# Patient Record
Sex: Female | Born: 1990 | Race: Black or African American | Hispanic: No | Marital: Single | Smoking: Never smoker
Health system: Southern US, Community
[De-identification: ages and names within clinical notes are randomized; demographics above are authoritative.]

## PROBLEM LIST (undated history)

## (undated) DIAGNOSIS — IMO0002 Reserved for concepts with insufficient information to code with codable children: Secondary | ICD-10-CM

## (undated) DIAGNOSIS — E785 Hyperlipidemia, unspecified: Secondary | ICD-10-CM

## (undated) DIAGNOSIS — Z8619 Personal history of other infectious and parasitic diseases: Secondary | ICD-10-CM

## (undated) DIAGNOSIS — G43909 Migraine, unspecified, not intractable, without status migrainosus: Secondary | ICD-10-CM

## (undated) DIAGNOSIS — M329 Systemic lupus erythematosus, unspecified: Secondary | ICD-10-CM

## (undated) HISTORY — PX: WISDOM TOOTH EXTRACTION: SHX21

## (undated) HISTORY — DX: Migraine, unspecified, not intractable, without status migrainosus: G43.909

## (undated) HISTORY — DX: Hyperlipidemia, unspecified: E78.5

## (undated) HISTORY — DX: Personal history of other infectious and parasitic diseases: Z86.19

---
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
## 2010-01-05 ENCOUNTER — Emergency Department (HOSPITAL_COMMUNITY): Admission: EM | Admit: 2010-01-05 | Discharge: 2010-01-05 | Payer: Self-pay | Admitting: Family Medicine

## 2010-10-24 LAB — POCT URINALYSIS DIP (DEVICE)
Bilirubin Urine: NEGATIVE
Glucose, UA: NEGATIVE mg/dL
Ketones, ur: NEGATIVE mg/dL
Nitrite: NEGATIVE
Protein, ur: 30 mg/dL — AB
Specific Gravity, Urine: 1.025 (ref 1.005–1.030)
Urobilinogen, UA: 0.2 mg/dL (ref 0.0–1.0)
pH: 6.5 (ref 5.0–8.0)

## 2010-10-24 LAB — POCT PREGNANCY, URINE: Preg Test, Ur: NEGATIVE

## 2010-10-24 LAB — GC/CHLAMYDIA PROBE AMP, GENITAL
Chlamydia, DNA Probe: NEGATIVE
GC Probe Amp, Genital: NEGATIVE

## 2010-10-24 LAB — WET PREP, GENITAL

## 2010-11-10 LAB — WET PREP, GENITAL
Clue Cells Wet Prep HPF POC: NONE SEEN
Trich, Wet Prep: NONE SEEN
Yeast Wet Prep HPF POC: NONE SEEN

## 2010-11-10 LAB — GC/CHLAMYDIA PROBE AMP, GENITAL: Chlamydia, DNA Probe: NEGATIVE

## 2010-11-12 LAB — POCT URINALYSIS DIP (DEVICE)
Protein, ur: 30 mg/dL — AB
Urobilinogen, UA: 1 mg/dL (ref 0.0–1.0)

## 2010-11-12 LAB — POCT PREGNANCY, URINE: Preg Test, Ur: NEGATIVE

## 2010-11-14 LAB — POCT PREGNANCY, URINE: Preg Test, Ur: NEGATIVE

## 2010-11-14 LAB — URINE CULTURE: Colony Count: 15000

## 2010-11-14 LAB — GC/CHLAMYDIA PROBE AMP, GENITAL
Chlamydia, DNA Probe: NEGATIVE
GC Probe Amp, Genital: NEGATIVE

## 2010-11-14 LAB — WET PREP, GENITAL: Trich, Wet Prep: NONE SEEN

## 2010-11-14 LAB — POCT URINALYSIS DIP (DEVICE)
Glucose, UA: NEGATIVE mg/dL
Ketones, ur: NEGATIVE mg/dL
Specific Gravity, Urine: 1.025 (ref 1.005–1.030)
Urobilinogen, UA: 1 mg/dL (ref 0.0–1.0)

## 2010-11-17 LAB — GC/CHLAMYDIA PROBE AMP, GENITAL
Chlamydia, DNA Probe: NEGATIVE
GC Probe Amp, Genital: NEGATIVE

## 2010-11-17 LAB — POCT URINALYSIS DIP (DEVICE)
Glucose, UA: NEGATIVE mg/dL
Specific Gravity, Urine: 1.025 (ref 1.005–1.030)
Urobilinogen, UA: 1 mg/dL (ref 0.0–1.0)

## 2010-11-17 LAB — WET PREP, GENITAL

## 2010-11-17 LAB — POCT PREGNANCY, URINE: Preg Test, Ur: NEGATIVE

## 2010-11-22 LAB — POCT URINALYSIS DIP (DEVICE)
Ketones, ur: NEGATIVE mg/dL
Nitrite: NEGATIVE
Protein, ur: NEGATIVE mg/dL
Urobilinogen, UA: 0.2 mg/dL (ref 0.0–1.0)
pH: 7 (ref 5.0–8.0)

## 2010-11-22 LAB — GC/CHLAMYDIA PROBE AMP, GENITAL
Chlamydia, DNA Probe: NEGATIVE
GC Probe Amp, Genital: NEGATIVE

## 2010-11-22 LAB — WET PREP, GENITAL
Trich, Wet Prep: NONE SEEN
Yeast Wet Prep HPF POC: NONE SEEN

## 2011-05-01 LAB — POCT URINALYSIS DIP (DEVICE)
Nitrite: NEGATIVE
Operator id: 235561
Protein, ur: 30 — AB
Urobilinogen, UA: 1
pH: 7

## 2011-05-01 LAB — GC/CHLAMYDIA PROBE AMP, GENITAL: Chlamydia, DNA Probe: POSITIVE — AB

## 2011-05-01 LAB — POCT PREGNANCY, URINE: Preg Test, Ur: NEGATIVE

## 2011-05-08 LAB — WET PREP, GENITAL
Clue Cells Wet Prep HPF POC: NONE SEEN
Trich, Wet Prep: NONE SEEN
Yeast Wet Prep HPF POC: NONE SEEN

## 2011-05-08 LAB — POCT URINALYSIS DIP (DEVICE)
Bilirubin Urine: NEGATIVE
Ketones, ur: NEGATIVE
Protein, ur: 30 — AB
Specific Gravity, Urine: 1.01
pH: 7

## 2011-05-08 LAB — GC/CHLAMYDIA PROBE AMP, GENITAL: GC Probe Amp, Genital: NEGATIVE

## 2011-05-11 LAB — POCT URINALYSIS DIP (DEVICE)
Glucose, UA: NEGATIVE mg/dL
Hgb urine dipstick: NEGATIVE
Nitrite: NEGATIVE
Protein, ur: NEGATIVE mg/dL
Urobilinogen, UA: 1 mg/dL (ref 0.0–1.0)

## 2011-05-11 LAB — WET PREP, GENITAL: WBC, Wet Prep HPF POC: NONE SEEN

## 2011-05-11 LAB — GC/CHLAMYDIA PROBE AMP, GENITAL: Chlamydia, DNA Probe: POSITIVE — AB

## 2011-05-11 LAB — POCT PREGNANCY, URINE: Preg Test, Ur: NEGATIVE

## 2013-11-28 DIAGNOSIS — M359 Systemic involvement of connective tissue, unspecified: Secondary | ICD-10-CM | POA: Insufficient documentation

## 2013-11-28 DIAGNOSIS — G43909 Migraine, unspecified, not intractable, without status migrainosus: Secondary | ICD-10-CM | POA: Insufficient documentation

## 2014-01-27 DIAGNOSIS — F419 Anxiety disorder, unspecified: Secondary | ICD-10-CM | POA: Insufficient documentation

## 2015-05-25 DIAGNOSIS — M1990 Unspecified osteoarthritis, unspecified site: Secondary | ICD-10-CM | POA: Insufficient documentation

## 2015-05-25 DIAGNOSIS — M329 Systemic lupus erythematosus, unspecified: Secondary | ICD-10-CM | POA: Insufficient documentation

## 2019-07-14 ENCOUNTER — Other Ambulatory Visit: Payer: Self-pay

## 2019-07-14 DIAGNOSIS — Z20822 Contact with and (suspected) exposure to covid-19: Secondary | ICD-10-CM

## 2019-07-15 LAB — NOVEL CORONAVIRUS, NAA: SARS-CoV-2, NAA: NOT DETECTED

## 2019-08-08 DIAGNOSIS — U071 COVID-19: Secondary | ICD-10-CM

## 2019-08-08 HISTORY — DX: COVID-19: U07.1

## 2019-08-12 ENCOUNTER — Ambulatory Visit: Payer: 59 | Attending: Internal Medicine

## 2019-08-12 DIAGNOSIS — Z20822 Contact with and (suspected) exposure to covid-19: Secondary | ICD-10-CM

## 2019-08-14 ENCOUNTER — Telehealth: Payer: Self-pay

## 2019-08-14 LAB — NOVEL CORONAVIRUS, NAA: SARS-CoV-2, NAA: DETECTED — AB

## 2019-08-14 NOTE — Telephone Encounter (Signed)
Patient advise that she can go back and retest her daughter 5-7 days after exposure. Patient verbalized understanding

## 2019-10-06 DIAGNOSIS — I639 Cerebral infarction, unspecified: Secondary | ICD-10-CM

## 2019-10-06 HISTORY — DX: Cerebral infarction, unspecified: I63.9

## 2019-10-23 DIAGNOSIS — G8194 Hemiplegia, unspecified affecting left nondominant side: Secondary | ICD-10-CM | POA: Insufficient documentation

## 2019-10-30 DIAGNOSIS — D709 Neutropenia, unspecified: Secondary | ICD-10-CM | POA: Insufficient documentation

## 2019-11-09 ENCOUNTER — Emergency Department (HOSPITAL_COMMUNITY): Payer: 59

## 2019-11-09 ENCOUNTER — Emergency Department (HOSPITAL_BASED_OUTPATIENT_CLINIC_OR_DEPARTMENT_OTHER): Payer: 59

## 2019-11-09 ENCOUNTER — Emergency Department (HOSPITAL_COMMUNITY)
Admission: EM | Admit: 2019-11-09 | Discharge: 2019-11-09 | Disposition: A | Discharge status: Home / Self Care | Payer: 59 | Attending: Emergency Medicine | Admitting: Emergency Medicine

## 2019-11-09 ENCOUNTER — Other Ambulatory Visit: Payer: Self-pay

## 2019-11-09 DIAGNOSIS — I69339 Monoplegia of upper limb following cerebral infarction affecting unspecified side: Secondary | ICD-10-CM | POA: Diagnosis not present

## 2019-11-09 DIAGNOSIS — Z7982 Long term (current) use of aspirin: Secondary | ICD-10-CM | POA: Insufficient documentation

## 2019-11-09 DIAGNOSIS — R52 Pain, unspecified: Secondary | ICD-10-CM | POA: Diagnosis not present

## 2019-11-09 DIAGNOSIS — Z8616 Personal history of COVID-19: Secondary | ICD-10-CM | POA: Diagnosis not present

## 2019-11-09 DIAGNOSIS — M3210 Systemic lupus erythematosus, organ or system involvement unspecified: Secondary | ICD-10-CM | POA: Diagnosis not present

## 2019-11-09 DIAGNOSIS — M25569 Pain in unspecified knee: Secondary | ICD-10-CM | POA: Insufficient documentation

## 2019-11-09 DIAGNOSIS — Z7902 Long term (current) use of antithrombotics/antiplatelets: Secondary | ICD-10-CM | POA: Diagnosis not present

## 2019-11-09 HISTORY — DX: Systemic lupus erythematosus, unspecified: M32.9

## 2019-11-09 HISTORY — DX: Reserved for concepts with insufficient information to code with codable children: IMO0002

## 2019-11-09 NOTE — ED Provider Notes (Signed)
[facility]-EMERGENCY DEPT Provider Note   CSN: [id number] Arrival date & time: 11/09/19  1541     History Chief Complaint  Patient presents with  . Knee Pain    Heidi Riley is a 29 y.o. female.  Heidi Riley is a 29 y.o. female+ with a history of stroke, lupus and COVID-19 infection, who presents to the ED for evaluation of pain behind both of her knees.  Patient states pain started yesterday.  She reports at 1 point she felt like she could feel a knot behind her right knee, but now that seems to have gone away.  She was just recently admitted to the hospital on 3/15 and diagnosed with an acute right thalamic stroke after coming in with sudden onset of left hemiparesis and paresthesias.  She has some paresthesia still in the left side but no other remaining deficits from stroke.  They are not sure what caused the stroke, and she is worried she could have blood clots in her legs.  She did have negative DVT studies while hospitalized.  She has been started on aspirin and Plavix but is not on any other blood thinners at this time.  Was on Lovenox while hospitalized.  She denies any strenuous activity or injury to the knees that she knows of.  She is not noticed any redness, swelling or warmth and has not had any fevers or chills.  Wonders if she could have pulled muscles.  No other aggravating or alleviating factors.  She denies any leg swelling.  No chest pain or shortness of breath.        Past Medical History:  Diagnosis Date  . COVID-19 08/2019  . Lupus (HCC)   . Stroke (HCC) 10/2019    There are no problems to display for this patient.     OB History   No obstetric history on file.     No family history on file.  Social History   Tobacco Use  . Smoking status: Not on file  Substance Use Topics  . Alcohol use: Not on file  . Drug use: Not on file    Home Medications Prior to Admission medications   Not on File    Allergies      Penicillins  Review of Systems   Review of Systems  Constitutional: Negative for chills and fever.  Respiratory: Negative for cough and shortness of breath.   Cardiovascular: Negative for chest pain and leg swelling.  Musculoskeletal: Positive for arthralgias. Negative for joint swelling.  Skin: Negative for color change and rash.  Neurological: Negative for weakness and numbness.  All other systems reviewed and are negative.   Physical Exam Updated Vital Signs BP 117/70 (BP Location: Left Arm)   Pulse 68   Temp 98.2 F (36.8 C) (Oral)   Resp 15   Ht 5\' 6"  (1.676 m)   Wt 68 kg   SpO2 100%   BMI 24.21 kg/m   Physical Exam Vitals and nursing note reviewed.  Constitutional:      General: She is not in acute distress.    Appearance: Normal appearance. She is well-developed and normal weight. She is not ill-appearing or diaphoretic.  HENT:     Head: Normocephalic and atraumatic.  Eyes:     General:        Right eye: No discharge.        Left eye: No discharge.  Pulmonary:     Effort: Pulmonary effort is normal. No respiratory distress.  Musculoskeletal:        General: Tenderness present.     Cervical back: Neck supple.     Comments: There is tenderness over the posterior aspect of both knees without any palpable deformity, no palpable Baker's cyst.  There is no erythema, warmth or swelling.  Distal pulses are 2+, there is no calf tenderness or palpable cord.  Skin:    General: Skin is warm and dry.  Neurological:     General: No focal deficit present.     Mental Status: She is alert and oriented to person, place, and time.     Coordination: Coordination normal.     Comments: 5/5 strength in bilateral lower extremities, sensation intact  Psychiatric:        Mood and Affect: Mood normal.        Behavior: Behavior normal.     ED Results / Procedures / Treatments   Labs (all labs ordered are listed, but only abnormal results are displayed) Labs Reviewed - No data  to display  EKG None  Radiology VAS [place] LOWER EXTREMITY VENOUS (DVT) ([facility] and [facility] 7a-7p)  Result Date: 11/09/2019  Lower Venous DVTStudy Indications: Pain, and Patient states she feels "knots" behind her knees.  Limitations: Body habitus. Comparison Study: No prior study on file Performing Technologist: Sharion Dove RVS  Examination Guidelines: A complete evaluation includes B-mode imaging, spectral Doppler, color Doppler, and power Doppler as needed of all accessible portions of each vessel. Bilateral testing is considered an integral part of a complete examination. Limited examinations for reoccurring indications may be performed as noted. The reflux portion of the exam is performed with the patient in reverse Trendelenburg.  +---------+---------------+---------+-----------+----------+--------------+ RIGHT    CompressibilityPhasicitySpontaneityPropertiesThrombus Aging +---------+---------------+---------+-----------+----------+--------------+ CFV      Full           Yes      Yes                                 +---------+---------------+---------+-----------+----------+--------------+ SFJ      Full                                                        +---------+---------------+---------+-----------+----------+--------------+ FV Prox  Full                                                        +---------+---------------+---------+-----------+----------+--------------+ FV Mid   Full                                                        +---------+---------------+---------+-----------+----------+--------------+ FV DistalFull                                                        +---------+---------------+---------+-----------+----------+--------------+ PFV  Full                                                        +---------+---------------+---------+-----------+----------+--------------+ POP      Full           Yes      Yes                                  +---------+---------------+---------+-----------+----------+--------------+ PTV      Full                                                        +---------+---------------+---------+-----------+----------+--------------+ PERO     Full                                                        +---------+---------------+---------+-----------+----------+--------------+   +---------+---------------+---------+-----------+----------+--------------+ LEFT     CompressibilityPhasicitySpontaneityPropertiesThrombus Aging +---------+---------------+---------+-----------+----------+--------------+ CFV      Full           Yes      Yes                                 +---------+---------------+---------+-----------+----------+--------------+ SFJ      Full                                                        +---------+---------------+---------+-----------+----------+--------------+ FV Prox  Full                                                        +---------+---------------+---------+-----------+----------+--------------+ FV Mid   Full                                                        +---------+---------------+---------+-----------+----------+--------------+ FV DistalFull                                                        +---------+---------------+---------+-----------+----------+--------------+ PFV      Full                                                        +---------+---------------+---------+-----------+----------+--------------+  POP      Full           Yes      Yes                                 +---------+---------------+---------+-----------+----------+--------------+ PTV      Full                                                        +---------+---------------+---------+-----------+----------+--------------+ PERO     Full                                                         +---------+---------------+---------+-----------+----------+--------------+     Summary: BILATERAL: - No evidence of deep vein thrombosis seen in the lower extremities, bilaterally.  RIGHT: - No cystic structure found in the popliteal fossa.  LEFT: - No cystic structure found in the popliteal fossa.  *See table(s) above for measurements and observations.    Preliminary     Procedures Procedures (including critical care time)  Medications Ordered in ED Medications - No data to display  ED Course  I have reviewed the triage vital signs and the nursing notes.  Pertinent labs & imaging results that were available during my care of the patient were reviewed by me and considered in my medical decision making (see chart for details).    MDM Rules/Calculators/A&P                     29 year old female presents with posterior knee pain bilaterally that started yesterday.  No trauma, no swelling or deformity.  Was recently diagnosed with a stroke, and is concerned she could have a blood clot she has not had swelling in the leg and does not have any calf pain on exam.  On exam she has no bony tenderness so I do not think that x-rays would be useful especially without any trauma or injury.  Will get bilateral DVT studies.  She has good pulses bilaterally, I have no concern for ischemia, she certainly could have pulled muscles  Vascular ultrasound completed bilateral DVT studies that showed no evidence of blood clot or Baker's cyst.  I discussed this information with the patient, she is frustrated that she does not know what is causing her knee pain-provided reassurance that we have ruled out life limiting organ threatening causes today, I have encouraged her to follow-up with her primary care doctor in the meantime recommend that she treat this like a strained muscle with Tylenol, ice, heat and over-the-counter muscle rubs or lidocaine patches.  Patient expresses understanding and agreement.  Discharged home  in good condition.  Final Clinical Impression(s) / ED Diagnoses Final diagnoses:  Posterior knee pain, unspecified laterality    Rx / DC Orders ED Discharge Orders    None       Dartha Lodge, [place] 11/10/19 1027    Terald Sleeper, MD 11/10/19 1105

## 2019-11-09 NOTE — ED Triage Notes (Signed)
PT to ED with c/o of knee pain and knots on the back of them. Pt states she recently was in the hospital for a stroke and wanted to make sure the pain and knots she was feeling weren't blood clots.

## 2019-11-09 NOTE — Discharge Instructions (Addendum)
Your evaluation today is reassuring did not show any evidence of a blood clot or Baker's cyst.  Your knees do not appear to be infected, and have good blood flow and normal nerve function.  This could be a pulled or irritated muscle or tendon.  You can take Tylenol, you can also use over-the-counter muscle rubs or lidocaine patches, and alternate ice and heat.  Please follow-up with your PCP for further evaluation.

## 2019-11-09 NOTE — Progress Notes (Signed)
VASCULAR LAB PRELIMINARY  PRELIMINARY  PRELIMINARY  PRELIMINARY  Bilateral lower extremity venous duplex completed.    Preliminary report:  See CV proc for preliminary results.  [address] PA-C, results  Kathaleen Dudziak, RVT 11/09/2019, 6:23 PM

## 2019-11-10 ENCOUNTER — Encounter (HOSPITAL_COMMUNITY): Payer: Self-pay | Admitting: Student

## 2020-01-25 DIAGNOSIS — Q2112 Patent foramen ovale: Secondary | ICD-10-CM | POA: Insufficient documentation

## 2021-02-15 LAB — HM HIV SCREENING LAB: HM HIV Screening: NEGATIVE

## 2021-02-16 LAB — HM HEPATITIS C SCREENING LAB: HM Hepatitis Screen: NEGATIVE

## 2021-10-20 DIAGNOSIS — E785 Hyperlipidemia, unspecified: Secondary | ICD-10-CM | POA: Insufficient documentation

## 2022-09-07 DIAGNOSIS — A6000 Herpesviral infection of urogenital system, unspecified: Secondary | ICD-10-CM | POA: Insufficient documentation

## 2023-11-20 DIAGNOSIS — E7800 Pure hypercholesterolemia, unspecified: Secondary | ICD-10-CM | POA: Insufficient documentation

## 2023-11-20 DIAGNOSIS — D649 Anemia, unspecified: Secondary | ICD-10-CM | POA: Insufficient documentation

## 2023-11-20 DIAGNOSIS — Z8673 Personal history of transient ischemic attack (TIA), and cerebral infarction without residual deficits: Secondary | ICD-10-CM | POA: Insufficient documentation

## 2023-11-25 DIAGNOSIS — F1011 Alcohol abuse, in remission: Secondary | ICD-10-CM | POA: Insufficient documentation

## 2023-11-25 DIAGNOSIS — E871 Hypo-osmolality and hyponatremia: Secondary | ICD-10-CM | POA: Insufficient documentation

## 2023-12-24 DIAGNOSIS — I639 Cerebral infarction, unspecified: Secondary | ICD-10-CM | POA: Insufficient documentation

## 2023-12-27 ENCOUNTER — Encounter: Payer: Self-pay | Admitting: Physician Assistant

## 2023-12-27 ENCOUNTER — Ambulatory Visit (INDEPENDENT_AMBULATORY_CARE_PROVIDER_SITE_OTHER): Admitting: Physician Assistant

## 2023-12-27 VITALS — BP 102/78 | HR 76 | Temp 98.3°F | Ht 66.0 in | Wt 171.0 lb

## 2023-12-27 DIAGNOSIS — E559 Vitamin D deficiency, unspecified: Secondary | ICD-10-CM | POA: Diagnosis not present

## 2023-12-27 DIAGNOSIS — Z8673 Personal history of transient ischemic attack (TIA), and cerebral infarction without residual deficits: Secondary | ICD-10-CM

## 2023-12-27 DIAGNOSIS — E785 Hyperlipidemia, unspecified: Secondary | ICD-10-CM | POA: Diagnosis not present

## 2023-12-27 DIAGNOSIS — M329 Systemic lupus erythematosus, unspecified: Secondary | ICD-10-CM | POA: Diagnosis not present

## 2023-12-27 MED ORDER — ATORVASTATIN CALCIUM 40 MG PO TABS: 40.0000 mg | ORAL_TABLET | Freq: Every day | ORAL | 3 refills | Status: AC

## 2023-12-27 MED ORDER — CHOLECALCIFEROL 1.25 MG (50000 UT) PO CAPS: 50000.0000 [IU] | ORAL_CAPSULE | ORAL | 0 refills | Status: DC

## 2023-12-27 MED ORDER — ASPIRIN 81 MG PO TBEC
81.0000 mg | DELAYED_RELEASE_TABLET | Freq: Every day | ORAL | 2 refills | Status: AC
Start: 2023-12-27 — End: ?

## 2023-12-27 NOTE — Patient Instructions (Addendum)
-  It was a pleasure to see you today! Please review your visit summary for helpful information -I would encourage you to follow your care via MyChart where you can access lab results, notes, messages, and more -If you feel that we did a nice job today, please complete your after-visit survey and leave us a Google review! Your CMA today was Kieandra and your provider was Dan Waddell, PA-C, DMSc -Please return for follow-up in about 2 months  

## 2023-12-27 NOTE — Assessment & Plan Note (Signed)
 Per hospitalist note, hypercoagulability workup is not indicated, nor is anticoagulation.  However, I am referring to outpatient hematology oncology for second opinion on this.

## 2023-12-27 NOTE — Assessment & Plan Note (Signed)
 Stop vitamin D2, instead begin vitamin D3 high-dose as a weekly regimen for the next few months with plan to recheck vitamin D levels at our next visit.  May also continue her daily vitamin D supplement of 2000 units if she would like

## 2023-12-27 NOTE — Assessment & Plan Note (Signed)
 Has rheumatology follow-up in June, will be considering restart of Plaquenil, but she needs an ophthalmology visit first which is also scheduled for June.  Continue specialist care.

## 2023-12-27 NOTE — Assessment & Plan Note (Signed)
 Continue atorvastatin as prescribed, refilling today.

## 2023-12-27 NOTE — Progress Notes (Signed)
 Date:  12/27/2023   Name:  Heidi Riley   DOB:  1991-07-14   MRN:  [id number]   Chief Complaint: Establish Care (Has cardiologist and rheumatologist )  HPI Heidi Riley is a very pleasant 33 year old female recently admitted to [facility] on 11/18/2023 for recurrent embolic stroke, here today to establish care. PMH includes SLE, right PCA CVA 10/20/2019, PFO, Vit D def, and migraines. 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 They suspect SLE as the cause, though also considering the PFO. She is on aspirin and atorvastatin - previously not compliant with ongoing aspirin therapy but presently reports good compliance.   Saw cardiology 12/14/23 to consult regarding the CVAs and PFO, placed 30-day heart monitor and referred to Dr. Micael Adas to consult on possible PFO closure. Saw Dr. Micael Adas 12/24/23 who is planning PFO closure if monitor is negative for A-fib.   Also saw neuro 12/24/23 who has her on topiramate for headache/migraine prevention and tizanidine +/Florette Hurry for acute headaches. Apparently anticoagulation not indicated per hem/onc consult during admission, though patient has not seen them as outpatient in some time.   Medication list has been reviewed and updated.  Current Meds  Medication Sig   aspirin EC 81 MG tablet Take 1 tablet (81 mg total) by mouth daily. Swallow whole.   Cholecalciferol 1.25 MG (50000 UT) capsule Take 1 capsule (50,000 Units total) by mouth once a week for 12 doses.   tiZANidine (ZANAFLEX) 2 MG tablet Take 1-2 tablets (2-4 mg) Every 6 hours as needed for headaches.   topiramate (TOPAMAX) 50 MG tablet Take 50 mg by mouth at bedtime.   Ubrogepant 100 MG TABS Take 100 mg by mouth.   valACYclovir (VALTREX) 1000 MG tablet Take 1,000 mg by mouth.   [DISCONTINUED] aspirin 81 MG chewable tablet Chew 1 tablet by mouth daily.   [DISCONTINUED] atorvastatin (LIPITOR) 40 MG tablet Take 1 tablet by mouth daily.      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   Systemic lupus erythematosus (HCC) 05/25/2015   Anxiety 01/27/2014   Connective tissue disorder (HCC) 11/28/2013   Migraines 11/28/2013    Allergies  Allergen Reactions   Penicillins Anaphylaxis, Hives, Itching, Rash, Dermatitis and Swelling    Other reaction(s): Itching  Other reaction(s): Itching  Other reaction(s): Rash  Other reaction(s): Itching  Other reaction(s): Rash  Other reaction(s): Itching   Clavulanic Acid Dermatitis   Doxycycline Hives    Immunization History  Administered Date(s) Administered   DTP 10/07/1991   DTP / HiB 09/27/1995   DTaP 04/30/1996, 05/26/1997   HIB, Unspecified 10/07/1991   HPV 9-valent 02/26/2006, 11/08/2009, 05/24/2010   HPV Quadrivalent 02/26/2006, 11/08/2009, 05/24/2010   Hep A, Unspecified 02/26/2006   Hep B, Unspecified 09/27/1995, 04/30/1996, 05/26/1997   Hepatitis A, Ped/Adol-2 Dose 02/26/2006, 11/08/2009, 11/08/2009   Hepatitis B, PED/ADOLESCENT 09/27/1995, 04/30/1996, 05/26/1997   MMR 09/27/1995, 04/30/1996   Meningococcal Conjugate 02/26/2006   Meningococcal polysaccharide vaccine (MPSV4) 02/26/2006   OPV 10/07/1991, 09/27/1995, 04/30/1996  Tdap 02/26/2006, 12/07/2016    Past Surgical History:  Procedure Laterality Date   WISDOM TOOTH EXTRACTION      Social History   Tobacco Use   Smoking status: Never   Smokeless tobacco: Never  Vaping Use   Vaping status: Never Used  Substance Use Topics    Alcohol use: Yes    Comment: ocassionally   Drug use: Not Currently    Family History  Problem Relation Age of Onset   Cancer Father         12/27/2023    1:35 PM  GAD 7 : Generalized Anxiety Score  Nervous, Anxious, on Edge 1  Control/stop worrying 1  Worry too much - different things 1  Trouble relaxing 1  Restless 1  Easily annoyed or irritable 1  Afraid - awful might happen 1  Total GAD 7 Score 7  Anxiety Difficulty Somewhat difficult       12/27/2023    1:35 PM  Depression screen PHQ 2/9  Decreased Interest 0  Down, Depressed, Hopeless 0  PHQ - 2 Score 0    BP Readings from Last 3 Encounters:  12/27/23 102/78  11/09/19 117/70    Wt Readings from Last 3 Encounters:  12/27/23 171 lb (77.6 kg)  11/09/19 150 lb (68 kg)    BP 102/78   Pulse 76   Temp 98.3 F (36.8 C)   Ht 5\' 6"  (1.676 m)   Wt 171 lb (77.6 kg)   SpO2 98%   BMI 27.60 kg/m   Physical Exam  Recent Labs  No results found for: "NA", "K", "CL", "CO2", "GLUCOSE", "BUN", "CREATININE", "CALCIUM", "PROT", "ALBUMIN", "AST", "ALT", "ALKPHOS", "BILITOT", "GFRNONAA", "GFRAA"  No results found for: "WBC", "HGB", "HCT", "MCV", "PLT" No results found for: "HGBA1C" No results found for: "CHOL", "HDL", "LDLCALC", "LDLDIRECT", "TRIG", "CHOLHDL" No results found for: "TSH"   Assessment and Plan:  History of recurrent embolic stroke Assessment & Plan: Per hospitalist note, hypercoagulability workup is not indicated, nor is anticoagulation.  However, I am referring to outpatient hematology oncology for second opinion on this.  Orders: -     Atorvastatin Calcium; Take 1 tablet (40 mg total) by mouth daily.  Dispense: 90 tablet; Refill: 3 -     Aspirin; Take 1 tablet (81 mg total) by mouth daily. Swallow whole.  Dispense: 180 tablet; Refill: 2 -     Ambulatory referral to Hematology / Oncology  Hyperlipidemia, unspecified hyperlipidemia type Assessment & Plan: Continue atorvastatin as prescribed,  refilling today.  Orders: -     Atorvastatin Calcium; Take 1 tablet (40 mg total) by mouth daily.  Dispense: 90 tablet; Refill: 3  Vitamin D deficiency Assessment & Plan: Stop vitamin D2, instead begin vitamin D3 high-dose as a weekly regimen for the next few months with plan to recheck vitamin D levels at our next visit.  May also continue her daily vitamin D supplement of 2000 units if she would like  Orders: -     Cholecalciferol; Take 1 capsule (50,000 Units total) by mouth once a week for 12 doses.  Dispense: 12 capsule; Refill: 0  Systemic lupus erythematosus, unspecified SLE type, unspecified organ involvement status [facility]) Assessment & Plan: Has rheumatology follow-up in June, will be considering restart of Plaquenil, but she needs an ophthalmology visit first which is also scheduled for June.  Continue specialist care.      Return in about 2 months (around 02/26/2024) for OV f/u chronic conditions.    Cody Das, PA-C,  DMSc, Nutritionist [facility] [phone]

## 2024-01-10 ENCOUNTER — Inpatient Hospital Stay: Attending: Internal Medicine | Admitting: Internal Medicine

## 2024-01-10 ENCOUNTER — Encounter: Payer: Self-pay | Admitting: Internal Medicine

## 2024-01-10 ENCOUNTER — Inpatient Hospital Stay

## 2024-01-10 DIAGNOSIS — Z79899 Other long term (current) drug therapy: Secondary | ICD-10-CM | POA: Insufficient documentation

## 2024-01-10 DIAGNOSIS — Z8673 Personal history of transient ischemic attack (TIA), and cerebral infarction without residual deficits: Secondary | ICD-10-CM

## 2024-01-10 DIAGNOSIS — Z7982 Long term (current) use of aspirin: Secondary | ICD-10-CM | POA: Insufficient documentation

## 2024-01-10 DIAGNOSIS — Z809 Family history of malignant neoplasm, unspecified: Secondary | ICD-10-CM | POA: Insufficient documentation

## 2024-01-10 LAB — CBC WITH DIFFERENTIAL/PLATELET
Abs Immature Granulocytes: 0.02 10*3/uL (ref 0.00–0.07)
Basophils Absolute: 0 10*3/uL (ref 0.0–0.1)
Basophils Relative: 1 %
Eosinophils Absolute: 0.1 10*3/uL (ref 0.0–0.5)
Eosinophils Relative: 2 %
HCT: 39.1 % (ref 36.0–46.0)
Hemoglobin: 12.7 g/dL (ref 12.0–15.0)
Immature Granulocytes: 1 %
Lymphocytes Relative: 36 %
Lymphs Abs: 1.5 10*3/uL (ref 0.7–4.0)
MCH: 28.2 pg (ref 26.0–34.0)
MCHC: 32.5 g/dL (ref 30.0–36.0)
MCV: 86.7 fL (ref 80.0–100.0)
Monocytes Absolute: 0.4 10*3/uL (ref 0.1–1.0)
Monocytes Relative: 10 %
Neutro Abs: 2.1 10*3/uL (ref 1.7–7.7)
Neutrophils Relative %: 50 %
Platelets: 246 10*3/uL (ref 150–400)
RBC: 4.51 MIL/uL (ref 3.87–5.11)
RDW: 14 % (ref 11.5–15.5)
WBC: 4.1 10*3/uL (ref 4.0–10.5)
nRBC: 0 % (ref 0.0–0.2)

## 2024-01-10 LAB — COMPREHENSIVE METABOLIC PANEL WITH GFR
ALT: 20 U/L (ref 0–44)
AST: 17 U/L (ref 15–41)
Albumin: 4.2 g/dL (ref 3.5–5.0)
Alkaline Phosphatase: 53 U/L (ref 38–126)
Anion gap: 6 (ref 5–15)
BUN: 16 mg/dL (ref 6–20)
CO2: 24 mmol/L (ref 22–32)
Calcium: 8.8 mg/dL — ABNORMAL LOW (ref 8.9–10.3)
Chloride: 108 mmol/L (ref 98–111)
Creatinine, Ser: 0.72 mg/dL (ref 0.44–1.00)
GFR, Estimated: >60 mL/min (ref >60)
Glucose, Bld: 95 mg/dL (ref 70–99)
Potassium: 4 mmol/L (ref 3.5–5.1)
Sodium: 138 mmol/L (ref 135–145)
Total Bilirubin: 0.3 mg/dL (ref 0.0–1.2)
Total Protein: 8.2 g/dL — ABNORMAL HIGH (ref 6.5–8.1)

## 2024-01-10 NOTE — Progress Notes (Signed)
 [facility] CONSULT NOTE  Patient Care Team: Leopoldo Rancher, PA as PCP - General (Physician Assistant) Gwyn Leos, MD as Consulting Physician (Oncology)  CHIEF COMPLAINTS/PURPOSE OF CONSULTATION: Recurrent stroke   HISTORY OF PRESENTING ILLNESS: Patient ambulating-independently.  Alone   Heidi Riley 33 y.o.  female pleasant patient with a past medical history significant for recurrent strokes with a dx of PFO; history of lupus has been referred to us  for further evaluation recommendations for possible hypercoagulable state.  Patient is currently recuperating from the stroke.  Patient is currently on the monitor to rule out A-fib.  She is currently on aspirin .  Patient denies any prior history of DVT or PE.  Denies any family history of blood clots.  Review of Systems  Constitutional:  Negative for chills, diaphoresis, fever, malaise/fatigue and weight loss.  HENT:  Negative for nosebleeds and sore throat.   Eyes:  Negative for double vision.  Respiratory:  Negative for cough, hemoptysis, sputum production, shortness of breath and wheezing.   Cardiovascular:  Negative for chest pain, palpitations, orthopnea and leg swelling.  Gastrointestinal:  Negative for abdominal pain, blood in stool, constipation, diarrhea, heartburn, melena, nausea and vomiting.  Genitourinary:  Negative for dysuria, frequency and urgency.  Musculoskeletal:  Negative for back pain and joint pain.  Skin: Negative.  Negative for itching and rash.  Neurological:  Negative for dizziness, tingling, focal weakness, weakness and headaches.  Endo/Heme/Allergies:  Does not bruise/bleed easily.  Psychiatric/Behavioral:  Negative for depression. The patient is not nervous/anxious and does not have insomnia.     MEDICAL HISTORY:  Past Medical History:  Diagnosis Date   COVID-19 08/2019   H/O cold sores    Hyperlipidemia    Lupus    Migraines    Stroke (HCC) 10/2019   2nd one 4/25     SURGICAL HISTORY: Past Surgical History:  Procedure Laterality Date   WISDOM TOOTH EXTRACTION      SOCIAL HISTORY: Social History   Socioeconomic History   Marital status: Single    Spouse name: Not on file   Number of children: 1   Years of education: Not on file   Highest education level: Not on file  Occupational History   Not on file  Tobacco Use   Smoking status: Never   Smokeless tobacco: Never  Vaping Use   Vaping status: Never Used  Substance and Sexual Activity   Alcohol use: Yes    Comment: ocassionally   Drug use: Not Currently   Sexual activity: Yes  Other Topics Concern   Not on file  Social History Narrative   Not on file   Social Drivers of Health   Financial Resource Strain: Low Risk  (11/20/2023)   Received from [facility]   Overall Financial Resource Strain (CARDIA)    Difficulty of Paying Living Expenses: Not hard at all  Food Insecurity: No Food Insecurity (01/10/2024)   Hunger Vital Sign    Worried About Running Out of Food in the Last Year: Never true    Ran Out of Food in the Last Year: Never true  Transportation Needs: No Transportation Needs (01/10/2024)   PRAPARE - Administrator, Civil Service (Medical): No    Lack of Transportation (Non-Medical): No  Physical Activity: Not on file  Stress: No Stress Concern Present (11/18/2023)   Received from [facility] - Occupational Stress Questionnaire    Feeling of Stress : Not at  all  Social Connections: Not on file  Intimate Partner Violence: Not At Risk (01/10/2024)   Humiliation, Afraid, Rape, and Kick questionnaire    Fear of Current or Ex-Partner: No    Emotionally Abused: No    Physically Abused: No    Sexually Abused: No    FAMILY HISTORY: Family History  Problem Relation Age of Onset   Cancer Father     ALLERGIES:  is allergic to penicillins, clavulanic acid, and doxycycline.  MEDICATIONS:  Current Outpatient  Medications  Medication Sig Dispense Refill   aspirin  EC 81 MG tablet Take 1 tablet (81 mg total) by mouth daily. Swallow whole. 180 tablet 2   atorvastatin  (LIPITOR) 40 MG tablet Take 1 tablet (40 mg total) by mouth daily. 90 tablet 3   Cholecalciferol  1.25 MG (50000 UT) capsule Take 1 capsule (50,000 Units total) by mouth once a week for 12 doses. 12 capsule 0   tiZANidine (ZANAFLEX) 2 MG tablet Take 1-2 tablets (2-4 mg) Every 6 hours as needed for headaches.     topiramate (TOPAMAX) 50 MG tablet Take 50 mg by mouth at bedtime.     Ubrogepant 100 MG TABS Take 100 mg by mouth.     valACYclovir (VALTREX) 1000 MG tablet Take 1,000 mg by mouth.     No current facility-administered medications for this visit.    PHYSICAL EXAMINATION:   Vitals:   01/10/24 1110  BP: 111/73  Pulse: 71  Resp: 16  Temp: 98 F (36.7 C)  SpO2: 100%   Filed Weights   01/10/24 1110  Weight: 174 lb 9.6 oz (79.2 kg)    Physical Exam Vitals and nursing note reviewed.  HENT:     Head: Normocephalic and atraumatic.     Mouth/Throat:     Pharynx: Oropharynx is clear.  Eyes:     Extraocular Movements: Extraocular movements intact.     Pupils: Pupils are equal, round, and reactive to light.  Cardiovascular:     Rate and Rhythm: Normal rate and regular rhythm.  Pulmonary:     Comments: Decreased breath sounds bilaterally.  Abdominal:     Palpations: Abdomen is soft.  Musculoskeletal:        General: Normal range of motion.     Cervical back: Normal range of motion.  Skin:    General: Skin is warm.  Neurological:     General: No focal deficit present.     Mental Status: She is alert and oriented to person, place, and time.  Psychiatric:        Behavior: Behavior normal.        Judgment: Judgment normal.     LABORATORY DATA:  I have reviewed the data as listed Lab Results  Component Value Date   WBC 4.1 01/10/2024   HGB 12.7 01/10/2024   HCT 39.1 01/10/2024   MCV 86.7 01/10/2024   PLT 246  01/10/2024   Recent Labs    01/10/24 1200  NA 138  K 4.0  CL 108  CO2 24  GLUCOSE 95  BUN 16  CREATININE 0.72  CALCIUM  8.8*  GFRNONAA >60  PROT 8.2*  ALBUMIN 4.2  AST 17  ALT 20  ALKPHOS 53  BILITOT 0.3    RADIOGRAPHIC STUDIES: I have personally reviewed the radiological images as listed and agreed with the findings in the report. No results found.   History of recurrent embolic stroke # hx of recurrent stroke x2- right; A. 2021 - temporal lobe CVA; B. 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 Currently on heart monitor /cardiac evaluation. On asprin; no anti-coagulation. Will rule out other hypercoagulable causes.   # hx of Lupus- rash on face/joints [Dr ?.]-rule out APS related hypercoagulable state- currently not on therapy- defer to PCP re: referral to rheumatology.   Thank you Mr. Laroy Plunk PA. for allowing me to participate in the care of your pleasant patient. Please do not hesitate to contact me with questions or concerns in the interim.  My chart # DISPOSITION: # labs- today- ordered # Follow up TBD- Dr.B  PCP- Cody Das-   Above plan of care was discussed with patient/family in detail.  My contact information was given to the patient/family.     Gwyn Leos, MD 01/10/2024 2:09 PM

## 2024-01-10 NOTE — Progress Notes (Signed)
 Currently wearing a heart monitor for PFO, cardiologist Albertus Alt. She sees neurologist Buhagiar, Cynthia Dries, FNP  at [facility].

## 2024-01-10 NOTE — Assessment & Plan Note (Addendum)
#   hx of recurrent stroke x2- right; A. 2021 - temporal lobe CVA; B. 2025 - Cerebellar infarct with basilar artery occlusion, acute infarcts bilateral forebrain] thought to be embolic- sec to PFO. Currently on heart monitor /cardiac evaluation. On asprin; no anti-coagulation. Will rule out other hypercoagulable causes.   # hx of Lupus- rash on face/joints [Dr ?.]-rule out APS related hypercoagulable state- currently not on therapy- defer to PCP re: referral to rheumatology.   Thank you Mr. Laroy Plunk PA. for allowing me to participate in the care of your pleasant patient. Please do not hesitate to contact me with questions or concerns in the interim.  My chart # DISPOSITION: # labs- today- ordered # Follow up TBD- Dr.B  PCP- Genella Kendall waddell-

## 2024-01-11 LAB — PROTEIN C ACTIVITY: Protein C Activity: 134 % (ref 73–180)

## 2024-01-11 LAB — BETA-2-GLYCOPROTEIN I ABS, IGG/M/A
Beta-2 Glyco I IgG: <9 GPI IgG units (ref 0–20)
Beta-2-Glycoprotein I IgA: <9 GPI IgA units (ref 0–25)
Beta-2-Glycoprotein I IgM: <9 GPI IgM units (ref 0–32)

## 2024-01-11 LAB — PROTEIN S ACTIVITY: Protein S Activity: 58 % — ABNORMAL LOW (ref 63–140)

## 2024-01-12 LAB — ANTIPHOSPHOLIPID SYNDROME PROF
Anticardiolipin IgG: <9 GPL U/mL (ref 0–14)
Anticardiolipin IgM: <9 [MPL'U]/mL (ref 0–12)
DRVVT: 34.4 s (ref 0.0–47.0)
PTT Lupus Anticoagulant: 26.9 s (ref 0.0–43.5)

## 2024-01-15 LAB — FACTOR 5 LEIDEN

## 2024-01-16 LAB — PROTHROMBIN GENE MUTATION

## 2024-01-30 ENCOUNTER — Other Ambulatory Visit: Payer: Self-pay | Admitting: Physician Assistant

## 2024-01-30 NOTE — Telephone Encounter (Unsigned)
 Copied from CRM [id number]. Topic: Clinical - Medication Refill >> Jan 30, 2024 11:56 AM Sophia H wrote: Medication: valACYclovir (VALTREX) 1000 MG tablet   Has the patient contacted their pharmacy? Yes, need updated rx from provider.   This is the patient's preferred pharmacy:  [id number] [place], [place] - [address] [place] [address] [address] Phone: [phone] Fax: [phone]  Is this the correct pharmacy for this prescription? Yes If no, delete pharmacy and type the correct one.   Has the prescription been filled recently? Never been filled by provider, Dr. Manya is her new provider  Is the patient out of the medication? Yes  Has the patient been seen for an appointment in the last year OR does the patient have an upcoming appointment? Yes  Can we respond through MyChart? Yes  Agent: Please be advised that Rx refills may take up to 3 business days. We ask that you follow-up with your pharmacy.

## 2024-01-31 ENCOUNTER — Other Ambulatory Visit: Payer: Self-pay | Admitting: Physician Assistant

## 2024-01-31 MED ORDER — VALACYCLOVIR HCL 1 G PO TABS: 1000.0000 mg | ORAL_TABLET | Freq: Every day | ORAL | 2 refills | Status: AC | PRN

## 2024-01-31 NOTE — Telephone Encounter (Signed)
 Requested medications are due for refill today.  yes  Requested medications are on the active medications list.  yes  Last refill. 11/27/2019   Future visit scheduled.   yes  Notes to clinic.  Medication is historical.    Requested Prescriptions  Pending Prescriptions Disp Refills   valACYclovir (VALTREX) 1000 MG tablet      Sig: Take 1 tablet (1,000 mg total) by mouth.     Antimicrobials:  Antiviral Agents - Anti-Herpetic Passed - 01/31/2024  3:24 PM      Passed - Valid encounter within last 12 months    Recent Outpatient Visits           1 month ago History of recurrent embolic stroke   [facility] at [facility], [place], [place]

## 2024-02-11 ENCOUNTER — Other Ambulatory Visit: Payer: Self-pay | Admitting: Physician Assistant

## 2024-02-11 DIAGNOSIS — E559 Vitamin D deficiency, unspecified: Secondary | ICD-10-CM

## 2024-02-13 NOTE — Telephone Encounter (Signed)
 Requested medication (s) are due for refill today: na   Requested medication (s) are on the active medication list: yes   Last refill:  12/27/23 - 03/14/24 #12 0 refills  Future visit scheduled: yes 02/26/24  Notes to clinic:   manuel review of las required. Do you want to refill Rx?     Requested Prescriptions  Pending Prescriptions Disp Refills   Cholecalciferol  (VITAMIN D3) 1.25 MG (50000 UT) CAPS [Pharmacy Med Name: VITAMIN D3 50,000 UNIT CAPSULE] 12 capsule 0    Sig: TAKE 1 CAPSULE (50,000 UNITS TOTAL) BY MOUTH ONCE A WEEK FOR 12 DOSES     Endocrinology:  Vitamins - Vitamin D Supplementation 2 Failed - 02/13/2024  3:45 PM      Failed - Manual Review: Route requests for 50,000 IU strength to the provider      Failed - Ca in normal range and within 360 days    Calcium   Date Value Ref Range Status  01/10/2024 8.8 (L) 8.9 - 10.3 mg/dL Final         Failed - Vitamin D in normal range and within 360 days    No results found for: [id number], CI6874NY7, [id number], 25OHVITD3, 25OHVITD2, 25OHVITD1, VD25OH       Passed - Valid encounter within last 12 months    Recent Outpatient Visits           1 month ago History of recurrent embolic stroke   [facility] at [facility], [place], [place]

## 2024-02-14 NOTE — Telephone Encounter (Signed)
 Please review. Was this only for 12 weeks?  KP

## 2024-02-27 ENCOUNTER — Encounter: Payer: Self-pay | Admitting: Physician Assistant

## 2024-02-27 ENCOUNTER — Ambulatory Visit: Admitting: Physician Assistant

## 2024-06-23 ENCOUNTER — Ambulatory Visit: Payer: Self-pay

## 2024-06-23 NOTE — Telephone Encounter (Signed)
 FYI Only or Action Required?: FYI only for provider: appointment scheduled on 11/18.  Patient was last seen in primary care on 12/27/2023 by Manya [place], PA.  Called Nurse Triage reporting Appointment.  Symptoms began several months ago.  Interventions attempted: Rest, hydration, or home remedies.  Symptoms are: gradually worsening.  Triage Disposition: See Physician Within 24 Hours  Patient/caregiver understands and will follow disposition?: Yes, will follow disposition  Copied from CRM #[id number]. Topic: Clinical - Red Word Triage >> Jun 23, 2024  3:13 PM Charlet HERO wrote: Red Word that prompted transfer to Nurse Triage: Patient is callign about anxiety she is stating that she already spoke to  DR and it is getting worse. [place] Reason for Disposition  [1] Depression AND [2] getting worse (e.g., sleeping poorly, less able to do activities of daily living)  Answer Assessment - Initial Assessment Questions 1. CONCERN: What happened that made you call today?     Sadness since stroke, intermittent for months, states she has talked to PCP already about this 2. DEPRESSION SYMPTOM SCREENING: How are you feeling overall? (e.g., decreased energy, increased sleeping or difficulty sleeping, difficulty concentrating, feelings of sadness, guilt, hopelessness, or worthlessness)     Feelings of sadness 3. RISK OF HARM - SUICIDAL IDEATION:  Do you ever have thoughts of hurting or killing yourself?  (e.g., yes, no, no but preoccupation with thoughts about death)     denies 4. RISK OF HARM - HOMICIDAL IDEATION:  Do you ever have thoughts of hurting or killing someone else?  (e.g., yes, no, no but preoccupation with thoughts about death)     denies 5. FUNCTIONAL IMPAIRMENT: How have things been going for you overall? Have you had more difficulty than usual doing your normal daily activities?  (e.g., better, same, worse; self-care, school, work, interactions)     denies 6. SUPPORT: Who  is with you now? Who do you live with? Do you have family or friends who you can talk to?      denies 7. THERAPIST: Do you have a counselor or therapist? If Yes, ask: What is their name?     denies 8. STRESSORS: Has there been any new stress or recent changes in your life?     denies 9. ALCOHOL USE OR SUBSTANCE USE (DRUG USE): Do you drink alcohol or use any illegal drugs?     denies 10. OTHER: Do you have any other physical symptoms right now? (e.g., fever)       denies 11. PREGNANCY: Is there any chance you are pregnant? When was your last menstrual period?       denies  Protocols used: Depression-A-AH

## 2024-06-24 ENCOUNTER — Ambulatory Visit (INDEPENDENT_AMBULATORY_CARE_PROVIDER_SITE_OTHER): Admitting: Physician Assistant

## 2024-06-24 ENCOUNTER — Encounter: Payer: Self-pay | Admitting: Physician Assistant

## 2024-06-24 VITALS — BP 110/82 | HR 68 | Temp 98.9°F | Ht 66.0 in | Wt 178.0 lb

## 2024-06-24 DIAGNOSIS — F411 Generalized anxiety disorder: Secondary | ICD-10-CM | POA: Diagnosis not present

## 2024-06-24 MED ORDER — PROPRANOLOL HCL 20 MG PO TABS: 20.0000 mg | ORAL_TABLET | Freq: Two times a day (BID) | ORAL | 1 refills | Status: AC | PRN

## 2024-06-24 NOTE — Progress Notes (Signed)
 Date:  06/24/2024   Name:  Heidi Riley   DOB:  06-15-1991   MRN:  [id number]   Chief Complaint: Depression  HPI  Chavon presents today for discussion of anxiety and depression which has historically been mild even without medication, but acutely worse over the last 1 to 2 weeks.  She states that yesterday she got in an argument with her boyfriend and spent some time at a friend's house instead.  When preparing to leave her friend's house, she felt unwell, dizzy, short of breath which improved after lying down with a cold rag and calming herself down.  She believes her anxiety to be worse due to proximity to the holidays.   Medication list has been reviewed and updated.  Current Meds  Medication Sig   aspirin  EC 81 MG tablet Take 1 tablet (81 mg total) by mouth daily. Swallow whole.   atorvastatin  (LIPITOR) 40 MG tablet Take 1 tablet (40 mg total) by mouth daily.   Cholecalciferol  (VITAMIN D3) 1.25 MG (50000 UT) CAPS TAKE 1 CAPSULE (50,000 UNITS TOTAL) BY MOUTH ONCE A WEEK FOR 12 DOSES   clopidogrel (PLAVIX) 75 MG tablet Take 75 mg by mouth.   hydroxychloroquine (PLAQUENIL) 200 MG tablet Take 200 mg by mouth.   propranolol (INDERAL) 20 MG tablet Take 1 tablet (20 mg total) by mouth 2 (two) times daily as needed (for anxiety).   rizatriptan (MAXALT) 5 MG tablet Take 5 mg by mouth.   topiramate (TOPAMAX) 50 MG tablet Take 50 mg by mouth at bedtime.   valACYclovir  (VALTREX ) 1000 MG tablet Take 1 tablet (1,000 mg total) by mouth daily as needed (use for up to 5 days at the onset of flare, stop once resolved).   [DISCONTINUED] lidocaine (LIDODERM) 5 % 1 patch daily.   [DISCONTINUED] tiZANidine (ZANAFLEX) 2 MG tablet Take 1-2 tablets (2-4 mg) Every 6 hours as needed for headaches.     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   Systemic lupus erythematosus (HCC) 05/25/2015   Anxiety 01/27/2014   Connective tissue disorder 11/28/2013   Migraines 11/28/2013    Allergies  Allergen Reactions   Penicillins Anaphylaxis, Dermatitis, Hives, Itching, Rash and Swelling    Other reaction(s): Itching  Other reaction(s): Itching  Other reaction(s): Rash  Other reaction(s): Itching  Other reaction(s): Rash  Other reaction(s): Itching  Other reaction(s): Itching  Other reaction(s): Itching Other reaction(s): Rash Other reaction(s): Itching  Other reaction(s): Rash Other reaction(s): Itching    Other reaction(s): Itching Other reaction(s): Rash Other reaction(s): Itching    Other reaction(s): Rash Other reaction(s): Itching   Clavulanic Acid Dermatitis   Doxycycline Hives    Immunization History  Administered Date(s) Administered   DTP 10/07/1991   DTP / HiB 09/27/1995   DTaP 04/30/1996, 05/26/1997   HIB, Unspecified 10/07/1991   HPV 9-valent 02/26/2006, 11/08/2009, 05/24/2010   HPV Quadrivalent 02/26/2006, 11/08/2009, 05/24/2010   Hep A, Unspecified 02/26/2006   Hep B, Unspecified 09/27/1995, 04/30/1996, 05/26/1997   Hepatitis A, Ped/Adol-2 Dose 02/26/2006,  11/08/2009, 11/08/2009   Hepatitis B, PED/ADOLESCENT 09/27/1995, 04/30/1996, 05/26/1997   MMR 09/27/1995, 04/30/1996   Meningococcal Conjugate 02/26/2006   Meningococcal polysaccharide vaccine (MPSV4) 02/26/2006   OPV 10/07/1991, 09/27/1995, 04/30/1996   Tdap 02/26/2006, 12/07/2016    Past Surgical History:  Procedure Laterality Date   WISDOM TOOTH EXTRACTION      Social History   Tobacco  Use   Smoking status: Never   Smokeless tobacco: Never  Vaping Use   Vaping status: Never Used  Substance Use Topics   Alcohol use: Yes    Comment: ocassionally   Drug use: Not Currently    Family History  Problem Relation Age of Onset   Cancer Father         06/24/2024    9:25 AM 12/27/2023    1:35 PM  GAD 7 : Generalized Anxiety Score  Nervous, Anxious, on Edge 3 1  Control/stop worrying 3 1  Worry too much - different things 3 1  Trouble relaxing 3 1  Restless 1 1  Easily annoyed or irritable 3 1  Afraid - awful might happen 0 1  Total GAD 7 Score 16 7  Anxiety Difficulty Somewhat difficult Somewhat difficult       06/24/2024    9:25 AM 01/10/2024   11:21 AM 12/27/2023    1:35 PM  Depression screen PHQ 2/9  Decreased Interest 2 0 0  Down, Depressed, Hopeless 2 0 0  PHQ - 2 Score 4 0 0  Altered sleeping 0    Tired, decreased energy 2    Change in appetite 0    Feeling bad or failure about yourself  0    Trouble concentrating 2    Moving slowly or fidgety/restless 0    Suicidal thoughts 0    PHQ-9 Score 8    Difficult doing work/chores Somewhat difficult      BP Readings from Last 3 Encounters:  06/24/24 110/82  01/10/24 111/73  12/27/23 102/78    Wt Readings from Last 3 Encounters:  06/24/24 178 lb (80.7 kg)  01/10/24 174 lb 9.6 oz (79.2 kg)  12/27/23 171 lb (77.6 kg)    BP 110/82   Pulse 68   Temp 98.9 F (37.2 C)   Ht 5' 6 (1.676 m)   Wt 178 lb (80.7 kg)   SpO2 98%   BMI 28.73 kg/m   Physical Exam Vitals and nursing note reviewed.  Constitutional:      Appearance: Normal appearance.  Cardiovascular:     Rate and Rhythm: Normal rate.  Pulmonary:     Effort: Pulmonary effort is normal.  Abdominal:     General: There is no distension.  Musculoskeletal:        General: Normal range of motion.  Skin:    General: Skin is warm and dry.  Neurological:     Mental Status: She is alert and oriented to person, place, and time.     Gait:  Gait is intact.  Psychiatric:        Mood and Affect: Affect normal. Mood is anxious.     Recent Labs     Component Value Date/Time   NA 138 01/10/2024 1200   K 4.0 01/10/2024 1200   CL 108 01/10/2024 1200   CO2 24 01/10/2024 1200   GLUCOSE 95 01/10/2024 1200   BUN 16 01/10/2024 1200   CREATININE 0.72 01/10/2024 1200   CALCIUM  8.8 (L) 01/10/2024 1200   PROT 8.2 (H) 01/10/2024 1200   ALBUMIN 4.2 01/10/2024  1200   AST 17 01/10/2024 1200   ALT 20 01/10/2024 1200   ALKPHOS 53 01/10/2024 1200   BILITOT 0.3 01/10/2024 1200   GFRNONAA >60 01/10/2024 1200    Lab Results  Component Value Date   WBC 4.1 01/10/2024   HGB 12.7 01/10/2024   HCT 39.1 01/10/2024   MCV 86.7 01/10/2024   PLT 246 01/10/2024   No results found for: HGBA1C No results found for: CHOL, HDL, LDLCALC, LDLDIRECT, TRIG, CHOLHDL No results found for: TSH    Assessment and Plan:  Generalized anxiety disorder Assessment & Plan: Patient seems primarily bothered by anxiety at this time, which has only recently worsened.  Considered SSRI/SNRI, but because this seems to be more than acute/situational issue, we will opt for propranolol instead which will have faster onset and will be easier to stop if desired.  We reviewed dosing for this medication can be twice per day on an as needed basis, and she may use it situationally or not at all on some days.  She verbalizes understanding.  Discussed potential for hypotension, although I think at this dose the risk is relatively low.  Short interval follow-up in about 3 weeks.  We discussed the importance of routine physical activity in reducing stress and anxiety.  May find benefit from yoga, mindfulness, and breathing techniques including box breathing as nonpharmacologic options for reducing anxiety.   Orders: -     Propranolol HCl; Take 1 tablet (20 mg total) by mouth 2 (two) times daily as needed (for anxiety).  Dispense: 90 tablet; Refill:  1     Return in about 3 weeks (around 07/15/2024) for OV f/u anx/dep.    Rolan Hoyle, PA-C, DMSc, Nutritionist [facility] [phone]

## 2024-06-24 NOTE — Assessment & Plan Note (Addendum)
 Patient seems primarily bothered by anxiety at this time, which has only recently worsened.  Considered SSRI/SNRI, but because this seems to be more than acute/situational issue, we will opt for propranolol instead which will have faster onset and will be easier to stop if desired.  We reviewed dosing for this medication can be twice per day on an as needed basis, and she may use it situationally or not at all on some days.  She verbalizes understanding.  Discussed potential for hypotension, although I think at this dose the risk is relatively low.  Short interval follow-up in about 3 weeks.  We discussed the importance of routine physical activity in reducing stress and anxiety.  May find benefit from yoga, mindfulness, and breathing techniques including box breathing as nonpharmacologic options for reducing anxiety.

## 2024-07-15 ENCOUNTER — Ambulatory Visit: Admitting: Physician Assistant
